# Patient Record
Sex: Female | Born: 2005 | Hispanic: Yes | Marital: Single | Smoking: Never smoker
Health system: Southern US, Community
[De-identification: ages and names within clinical notes are randomized; demographics above are authoritative.]

## PROBLEM LIST (undated history)

## (undated) DIAGNOSIS — R519 Headache, unspecified: Secondary | ICD-10-CM

---
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
## 2023-03-25 DIAGNOSIS — H53143 Visual discomfort, bilateral: Secondary | ICD-10-CM | POA: Diagnosis not present

## 2023-03-25 DIAGNOSIS — G43019 Migraine without aura, intractable, without status migrainosus: Secondary | ICD-10-CM | POA: Diagnosis not present

## 2023-03-25 DIAGNOSIS — R519 Headache, unspecified: Secondary | ICD-10-CM | POA: Diagnosis not present

## 2023-06-16 ENCOUNTER — Telehealth: Payer: Self-pay | Admitting: Pediatrics

## 2023-06-16 NOTE — Telephone Encounter (Signed)
Patient is calling in today in regards to making sure she is up to date on her immunization    Meningitis vaccine is the one she is making sure that she has received

## 2023-06-16 NOTE — Telephone Encounter (Signed)
Patient just needs her second Bexsero.  Attempted call, lvtrc

## 2023-06-17 NOTE — Telephone Encounter (Signed)
Attempted call, lvtrc 

## 2023-06-20 NOTE — Telephone Encounter (Signed)
Tried to call patient but phone got disconnected, will try again later.

## 2023-06-20 NOTE — Telephone Encounter (Signed)
Attempted call, lvtrc 

## 2023-07-25 ENCOUNTER — Encounter: Payer: Self-pay | Admitting: *Deleted

## 2023-07-25 ENCOUNTER — Ambulatory Visit
Admission: EM | Admit: 2023-07-25 | Discharge: 2023-07-25 | Disposition: A | Discharge status: Home / Self Care | Payer: Medicaid Other | Attending: Emergency Medicine | Admitting: Emergency Medicine

## 2023-07-25 ENCOUNTER — Other Ambulatory Visit: Payer: Self-pay

## 2023-07-25 DIAGNOSIS — B349 Viral infection, unspecified: Secondary | ICD-10-CM | POA: Diagnosis not present

## 2023-07-25 DIAGNOSIS — J029 Acute pharyngitis, unspecified: Secondary | ICD-10-CM | POA: Diagnosis not present

## 2023-07-25 HISTORY — DX: Headache, unspecified: R51.9

## 2023-07-25 LAB — POCT RAPID STREP A (OFFICE): Rapid Strep A Screen: NEGATIVE

## 2023-07-25 NOTE — ED Triage Notes (Signed)
Reports hx HAs with dizziness, esp over past couple weeks. C/O sore throat, HA, upset stomach, poor appetite over past couple days. C/O tactile fever. Has taken Rx HA pill with some relief, but c/o continued dizziness.

## 2023-07-25 NOTE — Discharge Instructions (Signed)
Most likely you have a viral illness: no antibiotic as indicated at this time, throat culture pending. May treat with OTC meds of choice(chloraseptic throat lozenges, tylenol,ibuprofen, etc). Make sure to drink plenty of fluids to stay hydrated(gatorade, water, popsicles,jello,etc), avoid caffeine products. Follow up with PCP. Return as needed.

## 2023-07-25 NOTE — ED Provider Notes (Signed)
[facility]    CSN: [id number] Arrival date & time: 07/25/23  0944      History   Chief Complaint Chief Complaint  Patient presents with   Sore Throat   Headache    HPI Kayla Cummings is a 17 y.o. female.   Kayla Cummings, 17 year old female, presents to urgent care for evaluation of headache dizziness over the past couple of weeks, patient has a history of the same and takes Maxalt.  Patient also complaining of sore throat headache upset stomach poor appetite last couple of days.   The history is provided by the patient and a relative. No language interpreter was used.    Past Medical History:  Diagnosis Date   Acne 06/2014   Constipation 05/2010   Headache    Hirsutism 06/2019   Vision impairment 11/2018    Patient Active Problem List   Diagnosis Date Noted   Sore throat 07/25/2023   Nonspecific syndrome suggestive of viral illness 07/25/2023   Adenotonsillitis, acute 12/24/2017    History reviewed. No pertinent surgical history.  OB History   No obstetric history on file.      Home Medications    Prior to Admission medications   Medication Sig Start Date End Date Taking? Authorizing Provider  rizatriptan (MAXALT) 10 MG tablet Take by mouth. 03/26/23  Yes [provider]  ciprofloxacin-dexamethasone (CIPRODEX) OTIC suspension Place 4 drops into the left ear 2 (two) times daily. 06/01/22   Vella Kohler, MD  clindamycin-benzoyl peroxide (BENZACLIN) gel APPLY TO AFFECTED AREA ONCE DAILY. 05/25/22   Johny Drilling, DO  hydrocortisone 2.5 % cream Apply topically to rash twice daily until resolution 09/29/20   [provider]  neomycin-polymyxin-hydrocortisone (CORTISPORIN) 3.5-10000-1 OTIC suspension Place 4 drops into the left ear 3 (three) times daily. 06/01/22   Niel Hummer, MD  salicylic acid 17 % gel Apply topically daily. Patient not taking: Reported on 05/26/2020 01/16/20   Vella Kohler, MD  salicylic acid 17 % gel Apply  topically. 01/16/20   [provider]    Family History Family History  Problem Relation Age of Onset   Hypertension Father    Hypertension Paternal Grandmother    Diabetes Paternal Grandmother    Hypertension Paternal Grandfather    Diabetes Paternal Grandfather    Colon cancer Maternal Grandfather     Social History Social History   Tobacco Use   Smoking status: Never   Smokeless tobacco: Never  Vaping Use   Vaping status: Never Used  Substance Use Topics   Alcohol use: Never   Drug use: Never     Allergies   Patient has no known allergies.   Review of Systems Review of Systems  Constitutional:  Positive for appetite change and fever.  HENT:  Positive for sore throat.   Gastrointestinal:  Positive for diarrhea.  Genitourinary:  Negative for dysuria.  Neurological:  Positive for dizziness and headaches.  All other systems reviewed and are negative.    Physical Exam Triage Vital Signs ED Triage Vitals  Encounter Vitals Group     BP 07/25/23 1123 110/75     Systolic BP Percentile --      Diastolic BP Percentile --      Pulse Rate 07/25/23 1123 (!) 114     Resp 07/25/23 1123 16     Temp 07/25/23 1123 98.9 F (37.2 C)     Temp Source 07/25/23 1123 Oral     SpO2 07/25/23 1123 97 %  Weight --      Height --      Head Circumference --      Peak Flow --      Pain Score 07/25/23 1126 6     Pain Loc --      Pain Education --      Exclude from Growth Chart --    No data found.  Updated Vital Signs BP 110/75   Pulse (!) 114   Temp 98.9 F (37.2 C) (Oral)   Resp 16   LMP 07/04/2023 (Approximate)   SpO2 97%   Visual Acuity Right Eye Distance:   Left Eye Distance:   Bilateral Distance:    Right Eye Near:   Left Eye Near:    Bilateral Near:     Physical Exam Vitals and nursing note reviewed.  Constitutional:      General: She is not in acute distress.    Appearance: She is well-developed and well-groomed.  HENT:     Head:  Normocephalic and atraumatic.  Eyes:     Conjunctiva/sclera: Conjunctivae normal.  Cardiovascular:     Rate and Rhythm: Normal rate and regular rhythm.     Pulses: Normal pulses.     Heart sounds: Normal heart sounds. No murmur heard. Pulmonary:     Effort: Pulmonary effort is normal. No respiratory distress.     Breath sounds: Normal breath sounds and air entry.  Abdominal:     Palpations: Abdomen is soft.     Tenderness: There is no abdominal tenderness.  Musculoskeletal:        General: No swelling.     Cervical back: Neck supple.  Skin:    General: Skin is warm and dry.     Capillary Refill: Capillary refill takes less than 2 seconds.  Neurological:     General: No focal deficit present.     Mental Status: She is alert and oriented to person, place, and time.     GCS: GCS eye subscore is 4. GCS verbal subscore is 5. GCS motor subscore is 6.     Cranial Nerves: No cranial nerve deficit.     Sensory: No sensory deficit.  Psychiatric:        Attention and Perception: Attention normal.        Mood and Affect: Mood normal.        Speech: Speech normal.        Behavior: Behavior normal. Behavior is cooperative.      UC Treatments / Results  Labs (all labs ordered are listed, but only abnormal results are displayed) Labs Reviewed  CULTURE, GROUP A STREP [facility])  POCT RAPID STREP A (OFFICE)    EKG   Radiology No results found.  Procedures Procedures (including critical care time)  Medications Ordered in UC Medications - No data to display  Initial Impression / Assessment and Plan / UC Course  I have reviewed the triage vital signs and the nursing notes.  Pertinent labs & imaging results that were available during my care of the patient were reviewed by me and considered in my medical decision making (see chart for details).     Ddx: Sore throat, viral pharyngitis, Viral illness Final Clinical Impressions(s) / UC Diagnoses   Final diagnoses:  Sore throat   Nonspecific syndrome suggestive of viral illness     Discharge Instructions      Most likely you have a viral illness: no antibiotic as indicated at this time, throat culture pending. May treat with OTC  meds of choice(chloraseptic throat lozenges, tylenol,ibuprofen, etc). Make sure to drink plenty of fluids to stay hydrated(gatorade, water, popsicles,jello,etc), avoid caffeine products. Follow up with PCP. Return as needed.     ED Prescriptions   None    PDMP not reviewed this encounter.   Clancy Gourd, NP 07/25/23 1225

## 2023-07-28 LAB — CULTURE, GROUP A STREP (THRC): Special Requests: NORMAL

## 2023-08-02 DIAGNOSIS — J029 Acute pharyngitis, unspecified: Secondary | ICD-10-CM | POA: Diagnosis not present

## 2023-11-10 DIAGNOSIS — H5213 Myopia, bilateral: Secondary | ICD-10-CM | POA: Diagnosis not present

## 2024-07-25 ENCOUNTER — Ambulatory Visit: Payer: Self-pay
